# Patient Record
Sex: Female | Born: 1985
Health system: Southern US, Community
[De-identification: ages and names within clinical notes are randomized; demographics above are authoritative.]

## PROBLEM LIST (undated history)

## (undated) DIAGNOSIS — Z789 Other specified health status: Secondary | ICD-10-CM

## (undated) HISTORY — DX: Other specified health status: Z78.9

---
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
## 2008-10-13 ENCOUNTER — Ambulatory Visit: Payer: Self-pay | Admitting: Family Medicine

## 2008-10-28 ENCOUNTER — Ambulatory Visit: Payer: Self-pay | Admitting: Gynecology

## 2008-11-16 ENCOUNTER — Ambulatory Visit: Payer: Self-pay | Admitting: Gynecology

## 2009-02-27 ENCOUNTER — Emergency Department: Payer: Self-pay | Admitting: Emergency Medicine

## 2010-04-07 ENCOUNTER — Ambulatory Visit: Payer: Self-pay | Admitting: Family Medicine

## 2010-09-06 ENCOUNTER — Observation Stay: Payer: Self-pay | Admitting: Obstetrics and Gynecology

## 2010-09-07 ENCOUNTER — Ambulatory Visit: Payer: Self-pay

## 2010-09-08 ENCOUNTER — Observation Stay: Payer: Self-pay

## 2010-09-08 ENCOUNTER — Encounter: Payer: Self-pay | Admitting: Maternal and Fetal Medicine

## 2010-10-24 ENCOUNTER — Observation Stay: Payer: Self-pay | Admitting: Obstetrics & Gynecology

## 2010-12-25 ENCOUNTER — Observation Stay: Payer: Self-pay

## 2010-12-26 ENCOUNTER — Inpatient Hospital Stay: Payer: Self-pay | Admitting: Obstetrics & Gynecology

## 2010-12-28 DIAGNOSIS — O3432 Maternal care for cervical incompetence, second trimester: Secondary | ICD-10-CM

## 2014-05-08 ENCOUNTER — Emergency Department: Payer: Self-pay | Admitting: Emergency Medicine

## 2014-05-08 LAB — COMPREHENSIVE METABOLIC PANEL
ALT: 150 U/L — AB (ref 12–78)
ANION GAP: 5 — AB (ref 7–16)
Albumin: 4.2 g/dL (ref 3.4–5.0)
Alkaline Phosphatase: 110 U/L
BILIRUBIN TOTAL: 0.5 mg/dL (ref 0.2–1.0)
BUN: 12 mg/dL (ref 7–18)
CALCIUM: 9 mg/dL (ref 8.5–10.1)
CHLORIDE: 103 mmol/L (ref 98–107)
CO2: 28 mmol/L (ref 21–32)
Creatinine: 0.87 mg/dL (ref 0.60–1.30)
EGFR (African American): >60
EGFR (Non-African Amer.): >60
Glucose: 122 mg/dL — ABNORMAL HIGH (ref 65–99)
Osmolality: 273 (ref 275–301)
Potassium: 3.5 mmol/L (ref 3.5–5.1)
SGOT(AST): 89 U/L — ABNORMAL HIGH (ref 15–37)
Sodium: 136 mmol/L (ref 136–145)
TOTAL PROTEIN: 8.5 g/dL — AB (ref 6.4–8.2)

## 2014-05-08 LAB — URINALYSIS, COMPLETE
Bilirubin,UR: NEGATIVE
GLUCOSE, UR: NEGATIVE mg/dL (ref 0–75)
Ketone: NEGATIVE
NITRITE: NEGATIVE
PH: 5 (ref 4.5–8.0)
PROTEIN: NEGATIVE
RBC,UR: 7 /HPF (ref 0–5)
Specific Gravity: 1.03 (ref 1.003–1.030)
WBC UR: 13 /HPF (ref 0–5)

## 2014-05-08 LAB — CBC
HCT: 41.4 % (ref 35.0–47.0)
HGB: 13.9 g/dL (ref 12.0–16.0)
MCH: 29.9 pg (ref 26.0–34.0)
MCHC: 33.5 g/dL (ref 32.0–36.0)
MCV: 89 fL (ref 80–100)
Platelet: 243 10*3/uL (ref 150–440)
RBC: 4.64 10*6/uL (ref 3.80–5.20)
RDW: 12.8 % (ref 11.5–14.5)
WBC: 9.6 10*3/uL (ref 3.6–11.0)

## 2014-05-09 LAB — PREGNANCY, URINE: Pregnancy Test, Urine: NEGATIVE m[IU]/mL

## 2015-10-05 IMAGING — CT CT HEAD WITHOUT CONTRAST
1 series · 16 of 30 positions shown, 20 images · non-contrast
Comparison: None.

CLINICAL DATA: Syncope.  Headache.

EXAM:
CT HEAD WITHOUT CONTRAST
TECHNIQUE: Contiguous axial images were obtained from the base of the skull
through the vertex without intravenous contrast.

[Series 2: head wo · axial · 0.38mm/px · z∈[-95,+31]mm · 16 of 32 slices shown, 20 images]
[im 2/32  brain]
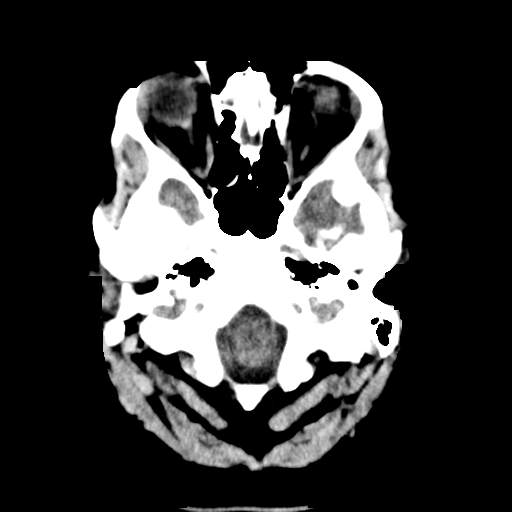
[im 2/32  bone]
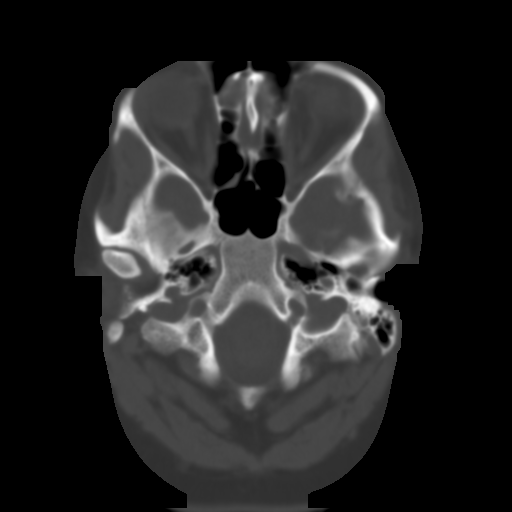
[im 4/32  brain]
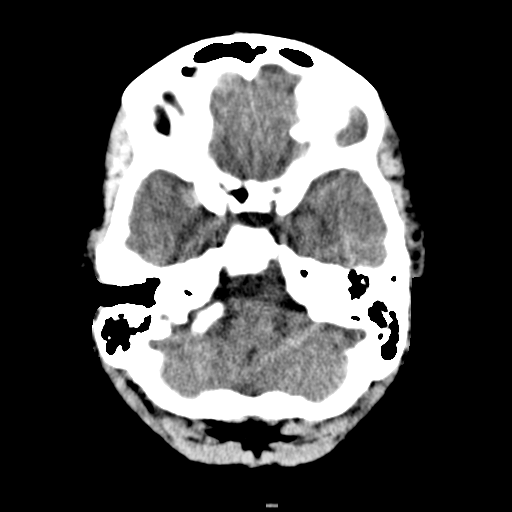
[im 6/32  brain]
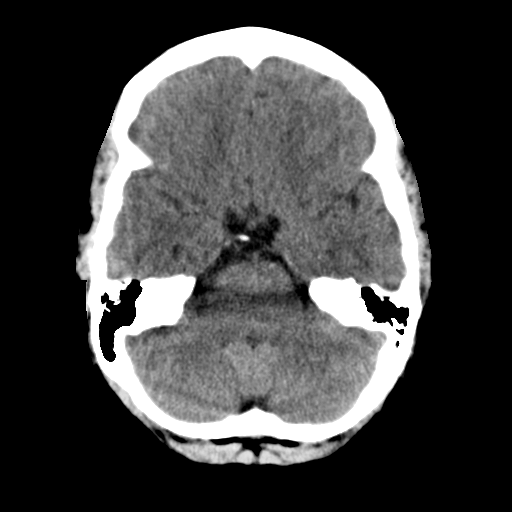
[im 8/32  brain]
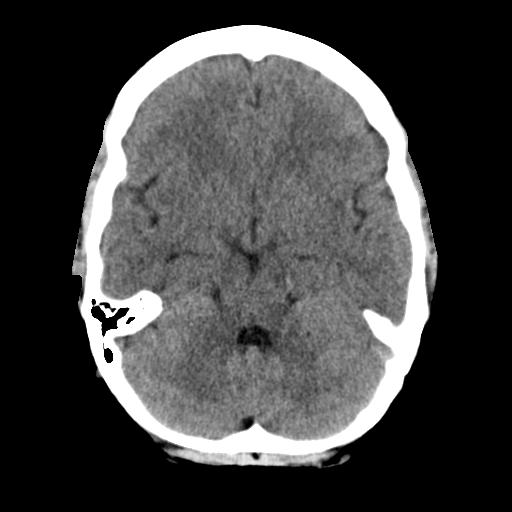
[im 9/32  brain]
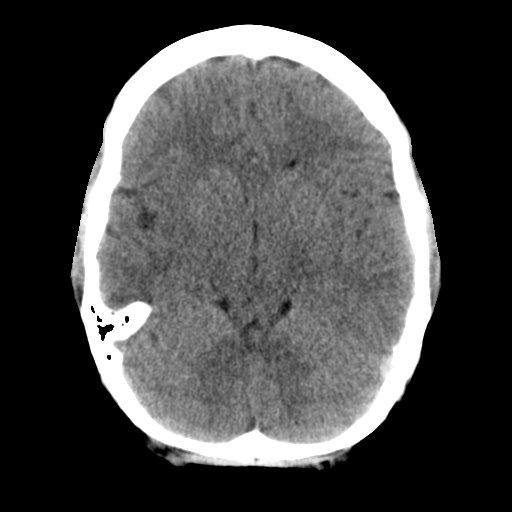
[im 9/32  bone]
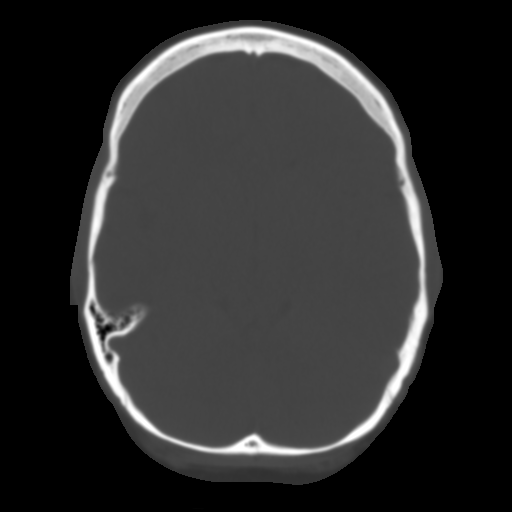
[im 11/32  brain]
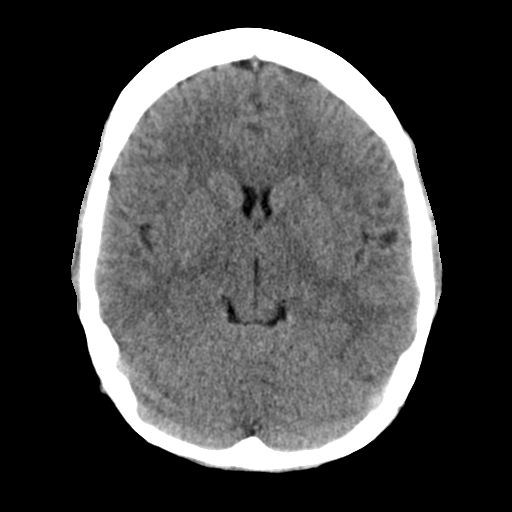
[im 13/32  brain]
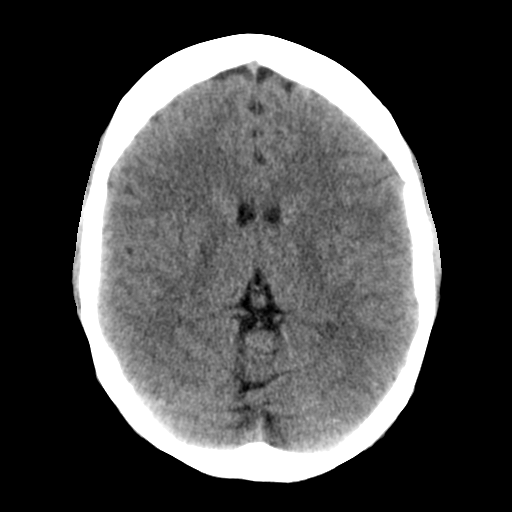
[im 15/32  brain]
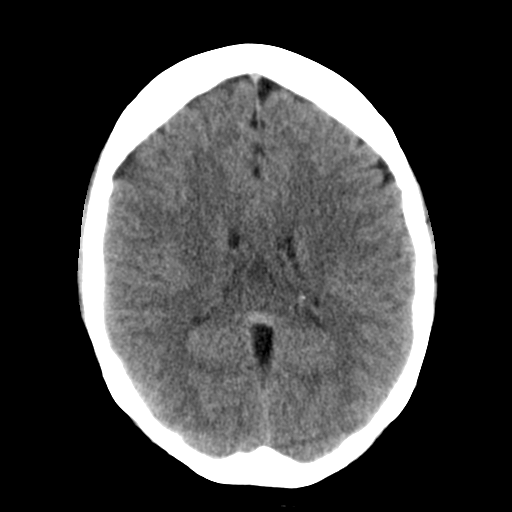
[im 17/32  brain]
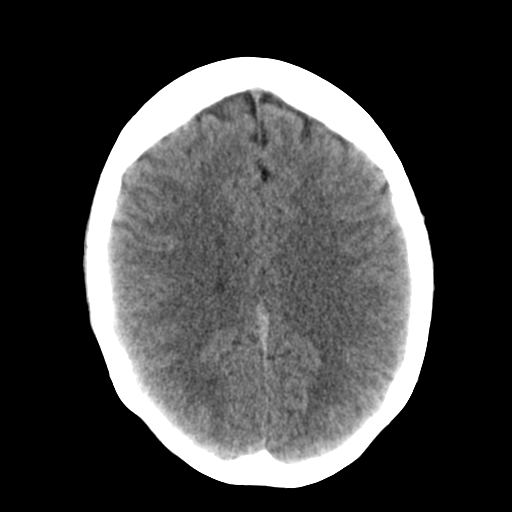
[im 17/32  bone]
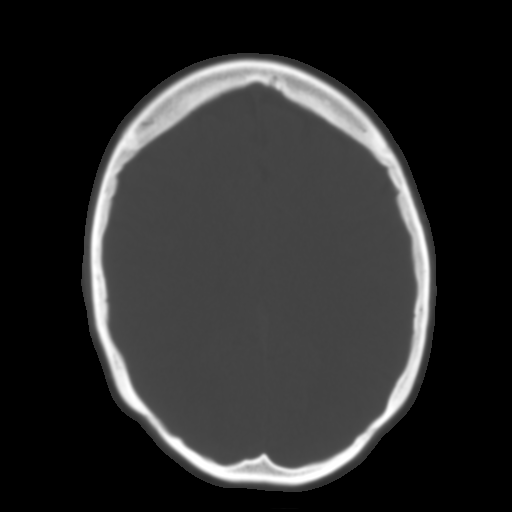
[im 19/32  brain]
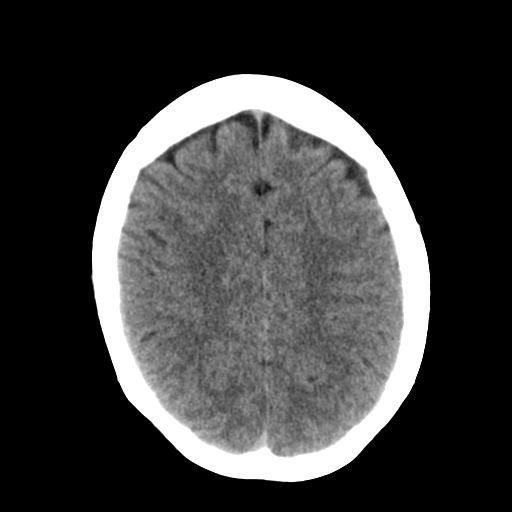
[im 21/32  brain]
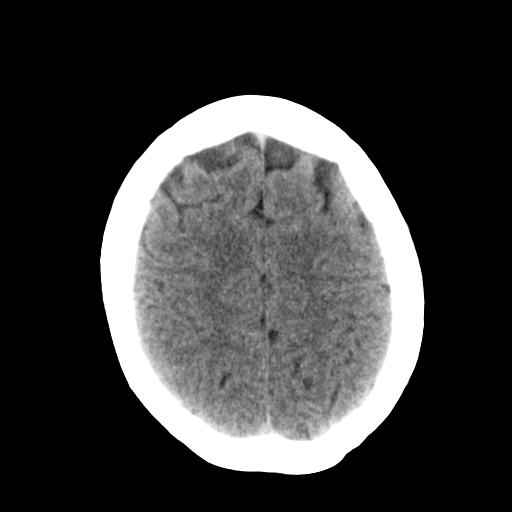
[im 23/32  brain]
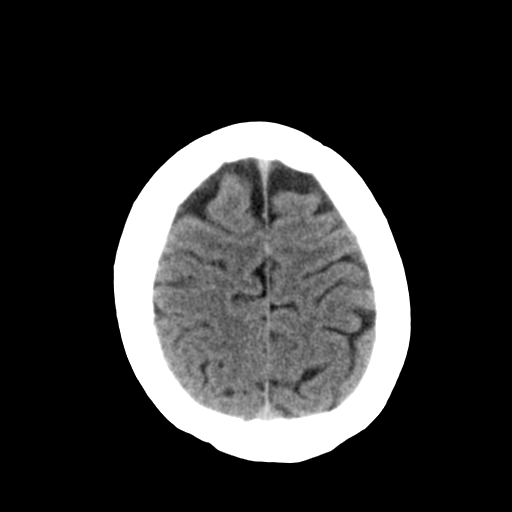
[im 24/32  brain]
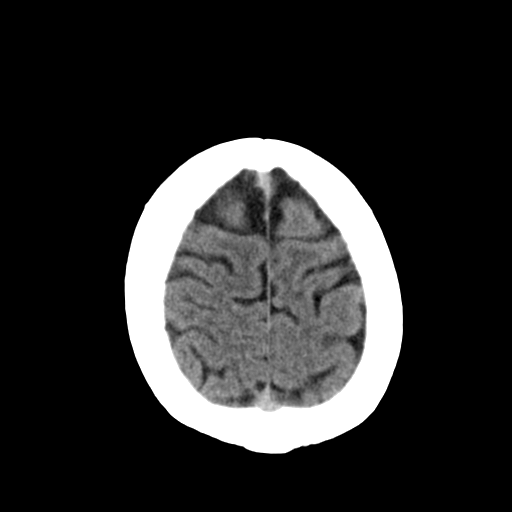
[im 24/32  bone]
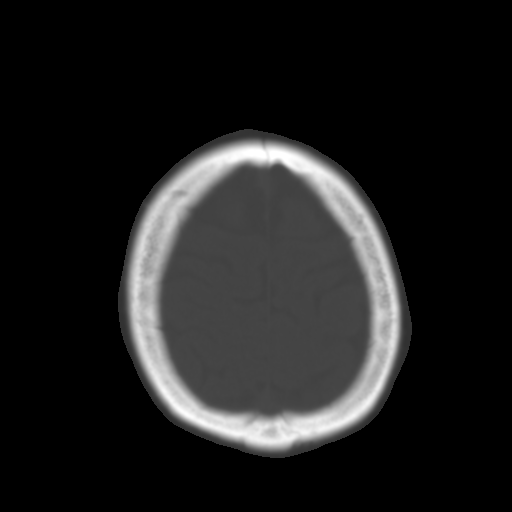
[im 26/32  brain]
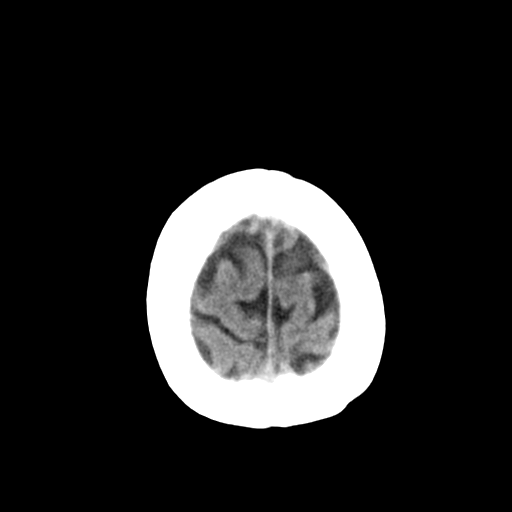
[im 28/32  brain]
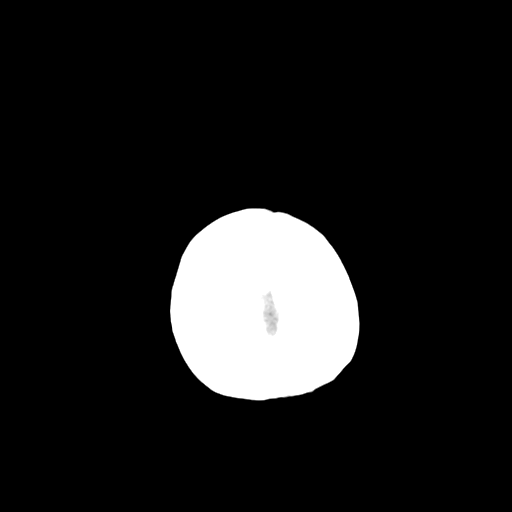
[im 30/32  brain]
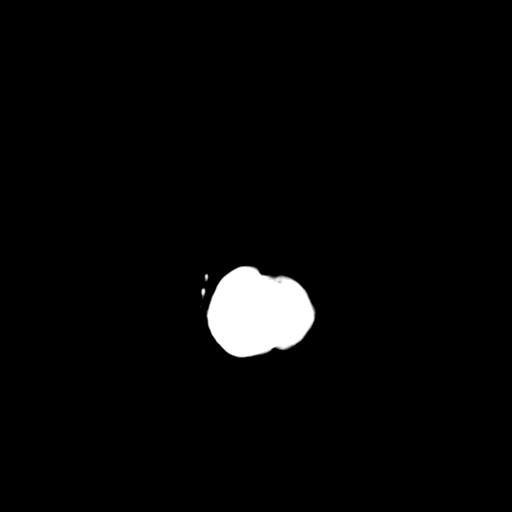

[16 of 30 positions shown; findings below may reference images not displayed]

FINDINGS: There is no evidence of acute infarction, mass lesion, or intra- or
extra-axial hemorrhage on CT.

The posterior fossa, including the cerebellum, brainstem and fourth
ventricle, is within normal limits. The third and lateral
ventricles, and basal ganglia are unremarkable in appearance. The
cerebral hemispheres are symmetric in appearance, with normal
gray-white differentiation. No mass effect or midline shift is seen.

There is no evidence of fracture; visualized osseous structures are
unremarkable in appearance. The visualized portions of the orbits
are within normal limits. The paranasal sinuses and mastoid air
cells are well-aerated. No significant soft tissue abnormalities are
seen.
IMPRESSION: Unremarkable noncontrast CT of the head.

## 2018-09-20 HISTORY — PX: CHOLECYSTECTOMY: SHX55

## 2020-11-30 ENCOUNTER — Ambulatory Visit: Admit: 2020-11-30 | Discharge status: Absent without leave | Payer: Self-pay

## 2023-02-21 ENCOUNTER — Ambulatory Visit (LOCAL_COMMUNITY_HEALTH_CENTER): Payer: Medicaid Other

## 2023-02-21 VITALS — BP 143/88 | Wt 166.5 lb

## 2023-02-21 DIAGNOSIS — Z3202 Encounter for pregnancy test, result negative: Secondary | ICD-10-CM

## 2023-02-21 LAB — PREGNANCY, URINE: Preg Test, Ur: NEGATIVE

## 2023-02-21 NOTE — Progress Notes (Signed)
UPT negative.  Pt states LMP 01/19/23 but has irregular menses.  Says she has done 3 home preg tests and faintly positive.  Wanting to be pregnant.  If no menses, pt will repeat preg test. See flowsheet.  Negative pregnancy packet provided.  Bp 143/88 and rechecked 171/84.  Denies h/a or vision problem but c/o some dizziness.  Pt advised to monitor Bp and f/u with PCP as needed.   Tonny Branch, RN   .

## 2024-09-23 ENCOUNTER — Ambulatory Visit (LOCAL_COMMUNITY_HEALTH_CENTER)

## 2024-09-23 VITALS — BP 139/83 | Ht 59.0 in | Wt 156.5 lb

## 2024-09-23 DIAGNOSIS — Z3201 Encounter for pregnancy test, result positive: Secondary | ICD-10-CM

## 2024-09-23 DIAGNOSIS — Z309 Encounter for contraceptive management, unspecified: Secondary | ICD-10-CM | POA: Diagnosis not present

## 2024-09-23 LAB — PREGNANCY, URINE: Preg Test, Ur: POSITIVE — AB

## 2024-09-23 MED ORDER — PRENATAL 27-0.8 MG PO TABS: 1.0000 | ORAL_TABLET | Freq: Every day | ORAL | Status: AC

## 2024-09-23 NOTE — Progress Notes (Signed)
 UPT positive. Unsure where she Plans prenatal care. Local prenatal provider list given and encouraged to establish care.   Positive preg packet given and reviewed.   The patient was dispensed prenatal vitamins #100 today per [facility] Dr JAYSON Helling. I provided counseling today regarding the medication. We discussed the medication, the side effects and when to call clinic. Patient given the opportunity to ask questions. Questions answered.    Sent to clerk for presumptive elig/medicaid/preg women. Kristen Nwosu, RN

## 2024-10-10 ENCOUNTER — Telehealth: Payer: Self-pay

## 2024-10-10 DIAGNOSIS — Z3480 Encounter for supervision of other normal pregnancy, unspecified trimester: Secondary | ICD-10-CM | POA: Insufficient documentation

## 2024-10-10 DIAGNOSIS — O3680X0 Pregnancy with inconclusive fetal viability, not applicable or unspecified: Secondary | ICD-10-CM

## 2024-10-10 DIAGNOSIS — Z3689 Encounter for other specified antenatal screening: Secondary | ICD-10-CM

## 2024-10-10 NOTE — Progress Notes (Signed)
 New OB Intake  I connected with  Kristen Coleman on 10/10/24 at  8:15 AM EST by MyChart Video Visit and verified that I am speaking with the correct person using two identifiers. Nurse is located at [facility] and pt is located at work.  I discussed the limitations, risks, security and privacy concerns of performing an evaluation and management service by telephone and the availability of in person appointments. I also discussed with the patient that there may be a patient responsible charge related to this service. The patient expressed understanding and agreed to proceed.  I explained I am completing New OB Intake today. We discussed her EDD of 05/27/25 that is based on LMP of 08/20/24. Pt is G3/P2002. I reviewed her allergies, medications, Medical/Surgical/OB history, and appropriate screenings. There are cats in the home: no. Based on history, this is a/an pregnancy uncomplicated . Her obstetrical history is significant for advanced maternal age, obesity, and CPD with ECD for G1 and RCD for G2. Patient would like to discuss VBAC.  Patient Active Problem List   Diagnosis Date Noted   Supervision of other normal pregnancy, antepartum 10/10/2024    Concerns addressed today: None  Delivery Plans:  Plans to deliver at [facility].  Anatomy US  Explained first scheduled US  will be 10/31/24. Anatomy US  will be scheduled around [redacted] weeks gestational age.  Labs Discussed genetic screening with patient. Patient desires genetic testing to be drawn at new OB visit. Discussed possible labs to be drawn at new OB appointment.  COVID Vaccine Patient has had COVID vaccine.   Social Determinants of Health Food Insecurity: expresses food insecurity. Information given on local food banks. WIC Referral: Patient is interested in referral to [facility].  Transportation: Patient denies transportation needs. Childcare: Discussed no children allowed at ultrasound appointments.   First visit  review I reviewed new OB appt with pt. I explained she will have blood work and pap smear/pelvic exam if indicated. Explained pt will be seen by Harlene Cisco, CNM at first visit; encounter routed to appropriate provider.   [place], LPN [id number]  9:02 AM

## 2024-10-10 NOTE — Patient Instructions (Signed)
 First Trimester of Pregnancy  The first trimester of pregnancy starts on the first day of your last monthly period until the end of week 13. This is months 1 through 3 of pregnancy. A week after a sperm fertilizes an egg, the egg will implant into the wall of the uterus and begin to develop into a baby. Body changes during your first trimester Your body goes through many changes during pregnancy. The changes usually return to normal after your baby is born. Physical changes Your breasts may grow larger and may hurt. The area around your nipples may get darker. Your periods will stop. Your hair and nails may grow faster. You may pee more often. Health changes You may tire easily. Your gums may bleed and may be sensitive when you brush and floss. You may not feel hungry. You may have heartburn. You may throw up or feel like you may throw up. You may want to eat some foods, but not others. You may have headaches. You may have trouble pooping (constipation). Other changes Your emotions may change from day to day. You may have more dreams. Follow these instructions at home: Medicines Talk to your health care provider if you're taking medicines. Ask if the medicines are safe to take during pregnancy. Your provider may change the medicines that you take. Do not take any medicines unless told to by your provider. Take a prenatal vitamin that has at least 600 micrograms (mcg) of folic acid. Do not use herbal medicines, illegal substances, or medicines that are not approved by your provider. Eating and drinking While you're pregnant your body needs extra food for your growing baby. Talk with your provider about what to eat while pregnant. Activity Most women are able to exercise during pregnancy. Exercises may need to change as your pregnancy goes on. Talk to your provider about your activities and exercise routines. Relieving pain and discomfort Wear a good, supportive bra if your breasts  hurt. Rest with your legs raised if you have leg cramps or low back pain. Safety Wear your seatbelt at all times when you're in a car. Talk to your provider if someone hits you, hurts you, or yells at you. Talk with your provider if you're feeling sad or have thoughts of hurting yourself. Lifestyle Certain things can be harmful while you're pregnant. Follow these rules: Do not use hot tubs, steam rooms, or saunas. Do not douche. Do not use tampons or scented pads. Do not drink alcohol,smoke, vape, or use products with nicotine or tobacco in them. If you need help quitting, talk with your provider. Avoid cat litter boxes and soil used by cats. These things carry germs that can cause harm to your pregnancy and your baby. General instructions Keep all follow-up visits. It helps you and your unborn baby stay as healthy as possible. Write down your questions. Take them to your visits. Your provider will: Talk with you about your overall health. Give you advice or refer you to specialists who can help with different needs, including: Prenatal education classes. Mental health and counseling. Foods and healthy eating. Ask for help if you need help with food. Call your dentist and ask to be seen. Brush your teeth with a soft toothbrush. Floss gently. Where to find more information American Pregnancy Association: americanpregnancy.org [employer]: acog.org Office on [employer] Health: [place] Contact a health care provider if: You feel dizzy, faint, or have a fever. You vomit or have watery poop (diarrhea) for 2  days or more. You have abnormal discharge or bleeding from your vagina. You have pain when you pee or your pee smells bad. You have cramps, pain, or pressure in your belly area. Get help right away if: You have trouble breathing or chest pain. You have any kind of injury, such as from a fall or a car crash. These symptoms may be an  emergency. Get help right away. Call 911. Do not wait to see if the symptoms will go away. Do not drive yourself to the hospital. This information is not intended to replace advice given to you by your health care provider. Make sure you discuss any questions you have with your health care provider. Document Revised: 08/09/2023 Document Reviewed: 03/09/2023 Elsevier Patient Education  2024 [employer] Severe Morning Sickness: What to Know Severe morning sickness is serious and can happen during pregnancy. You may hear it called hyperemesis gravidarum or HG. In severe morning sickness, you may vomit all day or for many days. You may also feel like vomiting for many days. Severe morning sickness can keep you from eating and drinking enough. This may make you lose too much fluid in your body, or become dehydrated. It can also lead to poor nutrition and weight loss. Severe morning sickness usually happens within the first 20 weeks of pregnancy. After that, it may go away for the rest of your pregnancy, but sometimes it doesn't. What are the causes? The cause of severe morning sickness is not known. It may be linked to: Changes in chemicals called hormones. Changes in the digestive system, such as the esophagus, the stomach, and the bowels. Conditions that are passed in families. What are the signs or symptoms? Very bad vomiting or feeling like you may vomit. These do not get better or go away. Loss of body fluids. No desire for food. Weight loss. How is this diagnosed? Severe morning sickness may be diagnosed based on your medical history, your symptoms, and an exam. Tests that may be done include: Blood tests. Urine tests. Checking blood pressure. How is this treated? You may be told to: Follow an eating plan to lessen vomiting. Eat or drink foods or fluids that have ginger, ginger ale, or ginger tea in them. Use acupressure bracelets or hypnosis. Take medicines. An eating plan and  medicines may be used together. If medicines don't help, you may need to get fluids through an IV. This is a small catheter that's put into a vein in your hand or arm. Follow these instructions at home: To help ease your symptoms, listen to your body. Everyone is different. Find what works best for you. Here are some things you can try to help relieve your symptoms: Meals and snacks  Eat 5-6 small meals or snacks instead of 3 large meals a day. Eat slowly. Before getting out of bed, eat a few crackers. Eat a snack with protein before bed. This could be cheese and crackers, or a peanut butter sandwich made with 1 slice of whole-wheat bread and 1 tsp (5 g) of peanut butter. Try to eat starchy foods as these are usually tolerated well. Examples include toast, bread, pasta, rice, and pretzels. Eat at least one serving of protein with your meals and snacks. Proteins include lean meats, poultry, seafood, beans, nuts, nut butters, eggs, cheese, and yogurt. Fluids It's important to have enough fluid in your body. Try to: Drink small amounts of fluids often. Drink fluids 30 minutes before or after a meal. This will help  lessen the feeling of a full stomach. Drink 100% fruit juice or an electrolyte drink. An electrolyte drink contains sodium, potassium, and chloride. Drink fluids that are cold, clear, and carbonated or sour. These include lemonade, ginger ale, lemon-lime soda, ice water, and sparkling water. Add  tsp (0.44 g) ground ginger to hot tea, or drink ginger tea. Other tips that can help Try to avoid: Eating foods that trigger your symptoms. These may include spicy foods, coffee, high-fat foods, and acidic foods. Drinking more than 1 cup of fluid at a time. Skipping meals. The feeling that you may vomit can be worse when your stomach is empty. But if you cannot tolerate food, don't force it. Try sucking on ice chips or other frozen liquids and make up for missed meals later. Do not lie down  for at least 2 hours after eating. Stay away from things that may make you vomit, such as: Food smells. Smoke. Strong smells. Loud noises. Warm, humid areas. Fast movements. General instructions Take your medicines only as told. Continue to take your prenatal vitamins as told. If you're having trouble taking them, talk with your provider about other choices. Talk with your provider about taking vitamin B6. Brush your teeth or use a mouth rinse after meals. Keep all follow-up visits. Your provider will check on your health and symptoms, as well as your baby's health. Contact a health care provider if: You have pain in your belly. You have a very bad headache. You can't see properly. You feel weak or dizzy. You can't eat or drink without throwing up, especially if this goes on for a full day. You throw up or feel like you may throw up, and the symptoms do not go away. You're losing weight. Get help right away if: There's blood in your vomit. You are very weak or faint. You have a fever and your symptoms suddenly get worse. You feel like your heart is racing or skipping a beat (palpitations). These symptoms may be an emergency. Get help right away. If you can't reach your provider, go to an urgent care or emergency room, or call 911. Do not wait to see if the symptoms will go away. Do not drive yourself to the hospital. This information is not intended to replace advice given to you by your health care provider. Make sure you discuss any questions you have with your health care provider. Document Revised: 05/02/2023 Document Reviewed: 05/02/2023 Elsevier Patient Education  2024 Else[employer] Inc. Commonly Asked Questions During Pregnancy  Cats: A parasite can be excreted in cat feces.  To avoid exposure you need to have another person empty the little box.  If you must empty the litter box you will need to wear gloves.  Wash your hands after handling your cat.  This parasite can also be  found in raw or undercooked meat so this should also be avoided.  Colds, Sore Throats, Flu: Please check your medication sheet to see what you can take for symptoms.  If your symptoms are unrelieved by these medications please call the office.  Dental Work: Most any dental work agricultural consultant recommends is permitted.  X-rays should only be taken during the first trimester if absolutely necessary.  Your abdomen should be shielded with a lead apron during all x-rays.  Please notify your provider prior to receiving any x-rays.  Novocaine is fine; gas is not recommended.  If your dentist requires a note from us  prior to dental work please call the office and  we will provide one for you.  Exercise: Exercise is an important part of staying healthy during your pregnancy.  You may continue most exercises you were accustomed to prior to pregnancy.  Later in your pregnancy you will most likely notice you have difficulty with activities requiring balance like riding a bicycle.  It is important that you listen to your body and avoid activities that put you at a higher risk of falling.  Adequate rest and staying well hydrated are a must!  If you have questions about the safety of specific activities ask your provider.    Exposure to Children with illness: Try to avoid obvious exposure; report any symptoms to us  when noted,  If you have chicken pos, red measles or mumps, you should be immune to these diseases.   Please do not take any vaccines while pregnant unless you have checked with your OB provider.  Fetal Movement: After 28 weeks we recommend you do kick counts twice daily.  Lie or sit down in a calm quiet environment and count your baby movements kicks.  You should feel your baby at least 10 times per hour.  If you have not felt 10 kicks within the first hour get up, walk around and have something sweet to eat or drink then repeat for an additional hour.  If count remains less than 10 per hour notify your  provider.  Fumigating: Follow your pest control agent's advice as to how long to stay out of your home.  Ventilate the area well before re-entering.  Hemorrhoids:   Most over-the-counter preparations can be used during pregnancy.  Check your medication to see what is safe to use.  It is important to use a stool softener or fiber in your diet and to drink lots of liquids.  If hemorrhoids seem to be getting worse please call the office.   Hot Tubs:  Hot tubs Jacuzzis and saunas are not recommended while pregnant.  These increase your internal body temperature and should be avoided.  Intercourse:  Sexual intercourse is safe during pregnancy as long as you are comfortable, unless otherwise advised by your provider.  Spotting may occur after intercourse; report any bright red bleeding that is heavier than spotting.  Labor:  If you know that you are in labor, please go to the hospital.  If you are unsure, please call the office and let us  help you decide what to do.  Lifting, straining, etc:  If your job requires heavy lifting or straining please check with your provider for any limitations.  Generally, you should not lift items heavier than that you can lift simply with your hands and arms (no back muscles)  Painting:  Paint fumes do not harm your pregnancy, but may make you ill and should be avoided if possible.  Latex or water based paints have less odor than oils.  Use adequate ventilation while painting.  Permanents & Hair Color:  Chemicals in hair dyes are not recommended as they cause increase hair dryness which can increase hair loss during pregnancy.   Highlighting and permanents are allowed.  Dye may be absorbed differently and permanents may not hold as well during pregnancy.  Sunbathing:  Use a sunscreen, as skin burns easily during pregnancy.  Drink plenty of fluids; avoid over heating.  Tanning Beds:  Because their possible side effects are still unknown, tanning beds are not  recommended.  Ultrasound Scans:  Routine ultrasounds are performed at approximately 20 weeks.  You will be able  to see your baby's general anatomy an if you would like to know the gender this can usually be determined as well.  If it is questionable when you conceived you may also receive an ultrasound early in your pregnancy for dating purposes.  Otherwise ultrasound exams are not routinely performed unless there is a medical necessity.  Although you can request a scan we ask that you pay for it when conducted because insurance does not cover  patient request scans.  Work: If your pregnancy proceeds without complications you may work until your due date, unless your physician or employer advises otherwise.  Round Ligament Pain/Pelvic Discomfort:  Sharp, shooting pains not associated with bleeding are fairly common, usually occurring in the second trimester of pregnancy.  They tend to be worse when standing up or when you remain standing for long periods of time.  These are the result of pressure of certain pelvic ligaments called round ligaments.  Rest, Tylenol and heat seem to be the most effective relief.  As the womb and fetus grow, they rise out of the pelvis and the discomfort improves.  Please notify the office if your pain seems different than that described.  It may represent a more serious condition.   Common Medications Safe in Pregnancy  Acne:      Constipation:  Benzoyl Peroxide     Colace  Clindamycin      Dulcolax Suppository  Topica Erythromycin     Fibercon  Salicylic Acid      Metamucil         Miralax AVOID:        Senakot   Accutane    Cough:  Retin-A       Cough Drops  Tetracycline      Phenergan w/ Codeine if Rx  Minocycline      Robitussin (Plain & DM)  Antibiotics:     Crabs/Lice:  Ceclor       RID  Cephalosporins    AVOID:  E-Mycins      Kwell  Keflex  Macrobid/Macrodantin   Diarrhea:  Penicillin      Kao-Pectate  Zithromax      Imodium AD         PUSH  FLUIDS AVOID:       Cipro     Fever:  Tetracycline      Tylenol (Regular or Extra  Minocycline       Strength)  Levaquin      Extra Strength-Do not          Exceed 8 tabs/24 hrs Caffeine:        200mg /day (equiv. To 1 cup of coffee or  approx. 3 12 oz sodas)         Gas: Cold/Hayfever:       Gas-X  Benadryl      Mylicon  Claritin       Phazyme  **Claritin-D        Chlor-Trimeton    Headaches:  Dimetapp      ASA-Free Excedrin  Drixoral-Non-Drowsy     Cold Compress  Mucinex (Guaifenasin)     Tylenol (Regular or Extra  Sudafed/Sudafed-12 Hour     Strength)  **Sudafed PE Pseudoephedrine   Tylenol Cold & Sinus     Vicks Vapor Rub  Zyrtec  **AVOID if Problems With Blood Pressure         Heartburn: Avoid lying down for at least 1 hour after meals  Aciphex      Maalox  Rash:  Milk of Magnesia     Benadryl    Mylanta       1% Hydrocortisone Cream  Pepcid  Pepcid Complete   Sleep Aids:  Prevacid      Ambien   Prilosec       Benadryl  Rolaids       Chamomile Tea  Tums (Limit 4/day)     Unisom         Tylenol PM         Warm milk-add vanilla or  Hemorrhoids:       Sugar for taste  Anusol/Anusol H.C.  (RX: Analapram 2.5%)  Sugar Substitutes:  Hydrocortisone OTC     Ok in moderation  Preparation H      Tucks        Vaseline lotion applied to tissue with wiping    Herpes:     Throat:  Acyclovir      Oragel  Famvir  Valtrex     Vaccines:         Flu Shot Leg Cramps:       *Gardasil  Benadryl      Hepatitis A         Hepatitis B Nasal Spray:       Pneumovax  Saline Nasal Spray     Polio Booster         Tetanus Nausea:       Tuberculosis test or PPD  Vitamin B6 25 mg TID   AVOID:    Dramamine      *Gardasil  Emetrol       Live Poliovirus  Ginger Root 250 mg QID    MMR (measles, mumps &  High Complex Carbs @ Bedtime    rebella)  Sea Bands-Accupressure    Varicella (Chickenpox)  Unisom 1/2 tab TID     *No known complications           If received  before Pain:         Known pregnancy;   Darvocet       Resume series after  Lortab        Delivery  Percocet    Yeast:   Tramadol      Femstat  Tylenol 3      Gyne-lotrimin  Ultram       Monistat  Vicodin           MISC:         All Sunscreens           Hair Coloring/highlights          Insect Repellant's          (Including DEET)         Mystic Tans  Questions to Ask Your Health Care Provider During Pregnancy  During pregnancy, you'll go through many changes. These will affect your body as well as your feelings and emotions. And you'll likely have a lot of questions. Your health care team is a good source for reliable answers. Make an appointment with your team if you're planning to get pregnant or as soon as you know that you're pregnant. New questions will come up as your pregnancy continues. Write your questions down and take them with you to your prenatal visits. Questions to ask about pregnancy Prenatal visits and tests How often will I have my prenatal visits? Should I visit the dentist during pregnancy? What type of screening tests should I consider? What type of routine tests are suggested and when  are they done? What are the risks and benefits of these tests? When would you recommend an ultrasound, and what will it show? Is there a nurse line or office line I can call if I have questions? Caring for yourself during pregnancy How much weight should I gain? What kind of exercise should I get and how much? How much sleep should I get? What kinds of things can I do to help with stress and anxiety? Are there any travel restrictions? Is it OK to have sex? Eating and drinking during pregnancy What vitamins or supplements do you recommend? When should I start taking my prenatal vitamins? What's a healthy diet for me during pregnancy? What foods should I eat? What foods should I not eat? What if I'm on a special diet? Should I limit how much caffeine I  have? Vaccinations What shots should I get? When is the best time to get them? Are there shots I should not get? Medicine and substance use during pregnancy Are my current medicines OK to keep taking? Which medicines could hurt me or my baby? Which supplements or herbal medicines could hurt me or my baby? Why is it important not to smoke or drink alcohol? What about other drugs or substances? Where can I get help if I'm having a hard time quitting? Questions to ask about labor and birth Getting ready What are my birth options? Should I make a birth plan? Where can I have my baby? Is a birth center or home birth an option for me? What are the benefits of breastfeeding? When should I start preparing for breastfeeding? Classes Are there breastfeeding classes or support groups? Where can I find childbirth classes? Pain relief What is natural childbirth? What can I do to decrease pain during labor and birth? What are other methods to help with pain during labor and birth? Birth What is induced labor? What's an episiotomy, and when might I need one? How can I increase my chance for a vaginal birth? When would a C-section be advised? Can I have a vaginal birth if I had a C-section in the past? Other questions to ask How long will I need to stay in the hospital after giving birth? How soon can I get pregnant after giving birth? What are my birth control options? What are the signs of perinatal depression? What should I do if I have depression or anxiety that's getting worse? This information is not intended to replace advice given to you by your health care provider. Make sure you discuss any questions you have with your health care provider. Document Revised: 07/31/2023 Document Reviewed: 07/31/2023 Elsevier Patient Education  2024 [employer] Inc. Tests and Screening During Pregnancy Tests and screenings during pregnancy are an important part of your prenatal care. These tests help  your health care provider find any problems that might affect your pregnancy. Some tests need to be done for all pregnant people, and some are optional. Most of the tests and screenings do not pose any risks for you or your baby. You may need more testing if a test result shows there is a risk to your health or your baby's health. Tests and screenings done early in pregnancy Some tests and screenings you may have in early pregnancy are: Blood tests, such as: Complete blood count (CBC). Blood typing. Tests to check for diseases that can cause birth defects or can be passed to your baby, such as: German measles (rubella( and chicken pox. Hepatitis B and C. Human  Immunodeficiency Virus (HIV). Syphilis. Zika virus. Pee tests. Blood pressure. Testing for sexually transmitted infections (STIs), such as chlamydia or gonorrhea. Testing for tuberculosis. Ultrasound. Tests and screenings done later in pregnancy Some common tests you can expect to have later in pregnancy include: Rh antibody testing. Pee and blood tests. Glucose screening. This checks your blood sugar. It will show whether you are developing the type of diabetes that happens during pregnancy, called gestational diabetes. You may have this screening earlier if you have risk factors for diabetes. Ultrasound. This may be repeated at 16-20 weeks to check how your baby is growing. Screening for group B streptococcus (GBS). GBS is a type of bacteria that may live in your rectum or vagina. GBS can spread to your baby during birth. This test is done at 35-37 weeks of pregnancy. Non-stress test. This may be done more often if your pregnancy is high risk. Biophysical profile. This test includes ultrasound imaging and a non-stress test to check to see if your baby is healthy. This test may help decide when your baby should be born. Screening for birth defects Early in your pregnancy, tests can be done to find out if your baby is at risk for a  genetic disorder. This testing is optional. The type of testing recommended for you will depend on your family and medical history, your ethnicity, and your age. Testing may include: Screening tests such as ultrasound, blood tests, or a combination of both. Carrier screening. If genetic screening shows that your baby is at risk for a genetic defect, diagnostic testing may be recommended, such as: Amniocentesis. Chorionic villus sampling. Unlike other tests done during pregnancy, diagnostic testing does have some risk for your pregnancy. Talk to your provider about the risks and benefits of genetic testing. Questions to ask your health care provider What tests are recommended for me? When and how will these tests be done? When will I get the results of the tests? What do the results of these tests mean for me or my baby? Do you recommend any genetic screening tests? Which ones? Should I see a genetic counselor before having genetic screening? Where to find more information Go to [email or web address] Click on search. Type 'prenatal tests in the search box. Go to [email or web address] Click on search. Type 'prenatal tests in the search box. Go to [email or web address] Click on search. Type routine tests in the search box. This information is not intended to replace advice given to you by your health care provider. Make sure you discuss any questions you have with your health care provider. Document Revised: 09/04/2023 Document Reviewed: 09/04/2023 Elsevier Patient Education  2025 [employer]. How a Baby Grows During Pregnancy Pregnancy starts when a fertilized egg attaches to the lining of the uterus and begins to grow. It is a time of many changes in the mother's body. The changes happen: To support your pregnancy. To help the baby grow. To prepare for the birth of your baby. How long does a pregnancy last? A pregnancy usually lasts 280 days, or about 40 weeks. Pregnancy is divided into three  periods of growth, also called trimesters: First trimester: weeks 0-13. Second trimester: weeks 14-27. Third trimester: weeks 28-40. The end of the 40 weeks of pregnancy is your likely date of delivery, or due date. However, most babies are not born on their due date. How does my baby develop month by month?  First and second month The brain, spinal cord, and heart begin to develop. By 6 weeks, the  heart begins to beat. The face, arms, and legs begin to form. Then the hands and feet begin to develop. All major organs begin to develop by the end of the second month. Third month All of the internal organs are forming. Bones and muscles are beginning to grow. The fetus is making movements similar to breathing. Fingernails and toenails are forming. Fourth month The skin is thin and transparent. The neck, outer ear, eyelids, and fingernails are formed. The external sex organs are formed. The fetus can hear, swallow, and move easily. The kidneys begin to make pee (urine). Fifth month The fetus moves around more and can be felt for the first time (quickening). The face, nose, and lips can be seen easily on ultrasound. The baby is covered with soft hair called lanugo. The organs in the digestive system work. Sixth month The lungs continue to grow and mature. The eyes open. The brain continues to develop. The fetus may begin to suck a finger. Skin ridges are formed that will be fingerprints and toe prints. Hair grows thicker and eyebrows can be seen. Seventh month Lungs are fully developed but not yet ready for birth. Eyes are developed enough to sense changes in light. The fetus responds to sound. The fetus kicks and stretches. Hands can make a grasping motion. Vernix, a waxy coating, is starting to develop to protect the skin. Eighth month Most organs and body systems are fully developed and working. Bones harden, and taste buds develop. The fetus may hiccup. The brain is still  developing. The skull remains soft. By week 31, most development is complete and the fetus is gaining weight fast. By the end of week 32, the fetus weighs a little more than 4 pounds (1.8 kg). Ninth month until your due date The lungs are fully developed and ready for birth. Patterns of sleep develop. The fetus weighs around 6 pounds at the beginning of the ninth month and may weigh around 8 pounds by your due date. The fetus's head typically moves into a head-down position in the uterus. Closer to your due date, the fetus's head may drop lower in your hips. How do I know if my baby is developing well? Always talk with your health care provider about any concerns that you may have about your pregnancy and your baby. At prenatal visits, your provider will do tests to check on your health and keep track of your baby's growth. These include: Fundal height and position. To do this, your provider will: Measure your growing belly from your pubic bone to the top of the uterus using a tape measure. Feel your belly to determine your baby's position. Heartbeat. An ultrasound in the first trimester can confirm pregnancy and show a heartbeat, depending on how far along you are. Your provider will check your baby's heart rate at prenatal visits. You may also have a second trimester ultrasound to check your baby's development. Follow these instructions at home: Take your medicines as told. Take prenatal vitamins as told by your provider. These include vitamins such as folic acid, iron, calcium, and vitamin D. They are important for healthy development of your baby. Keep all follow-up visits. These include prenatal care and screening tests to check your health and your baby's health. This information is not intended to replace advice given to you by your health care provider. Make sure you discuss any questions you have with your health care provider. Document Revised: 04/09/2023 Document Reviewed:  04/09/2023 Elsevier Patient Education  2024 Elsevier  Inc.

## 2024-10-31 ENCOUNTER — Other Ambulatory Visit: Payer: Self-pay

## 2024-11-07 ENCOUNTER — Other Ambulatory Visit: Payer: Self-pay | Admitting: Certified Nurse Midwife

## 2024-11-07 ENCOUNTER — Ambulatory Visit
Admission: RE | Admit: 2024-11-07 | Discharge: 2024-11-07 | Disposition: A | Discharge status: Home / Self Care | Payer: Self-pay | Source: Ambulatory Visit | Attending: Certified Nurse Midwife | Admitting: Certified Nurse Midwife

## 2024-11-07 DIAGNOSIS — Z3480 Encounter for supervision of other normal pregnancy, unspecified trimester: Secondary | ICD-10-CM

## 2024-11-07 DIAGNOSIS — O3680X0 Pregnancy with inconclusive fetal viability, not applicable or unspecified: Secondary | ICD-10-CM | POA: Insufficient documentation
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
## 2024-11-14 ENCOUNTER — Encounter: Payer: Self-pay | Admitting: Certified Nurse Midwife

## 2024-11-14 DIAGNOSIS — Z1151 Encounter for screening for human papillomavirus (HPV): Secondary | ICD-10-CM

## 2024-11-14 DIAGNOSIS — Z113 Encounter for screening for infections with a predominantly sexual mode of transmission: Secondary | ICD-10-CM

## 2024-11-14 DIAGNOSIS — Z369 Encounter for antenatal screening, unspecified: Secondary | ICD-10-CM

## 2024-11-14 DIAGNOSIS — Z114 Encounter for screening for human immunodeficiency virus [HIV]: Secondary | ICD-10-CM

## 2024-11-14 DIAGNOSIS — Z0184 Encounter for antibody response examination: Secondary | ICD-10-CM

## 2024-11-14 DIAGNOSIS — Z124 Encounter for screening for malignant neoplasm of cervix: Secondary | ICD-10-CM

## 2024-11-14 DIAGNOSIS — Z98891 History of uterine scar from previous surgery: Secondary | ICD-10-CM

## 2024-11-14 DIAGNOSIS — Z3480 Encounter for supervision of other normal pregnancy, unspecified trimester: Secondary | ICD-10-CM
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
## 2024-11-24 ENCOUNTER — Other Ambulatory Visit (HOSPITAL_COMMUNITY)
Admission: RE | Admit: 2024-11-24 | Discharge: 2024-11-24 | Disposition: A | Discharge status: Home / Self Care | Source: Ambulatory Visit | Attending: Certified Nurse Midwife | Admitting: Certified Nurse Midwife

## 2024-11-24 ENCOUNTER — Encounter: Payer: Self-pay | Admitting: Certified Nurse Midwife

## 2024-11-24 ENCOUNTER — Ambulatory Visit (INDEPENDENT_AMBULATORY_CARE_PROVIDER_SITE_OTHER): Admitting: Certified Nurse Midwife

## 2024-11-24 VITALS — BP 133/87 | HR 83 | Wt 162.3 lb

## 2024-11-24 DIAGNOSIS — Z3481 Encounter for supervision of other normal pregnancy, first trimester: Secondary | ICD-10-CM

## 2024-11-24 DIAGNOSIS — Z113 Encounter for screening for infections with a predominantly sexual mode of transmission: Secondary | ICD-10-CM | POA: Diagnosis present

## 2024-11-24 DIAGNOSIS — Z114 Encounter for screening for human immunodeficiency virus [HIV]: Secondary | ICD-10-CM

## 2024-11-24 DIAGNOSIS — Z131 Encounter for screening for diabetes mellitus: Secondary | ICD-10-CM

## 2024-11-24 DIAGNOSIS — Z3482 Encounter for supervision of other normal pregnancy, second trimester: Secondary | ICD-10-CM

## 2024-11-24 DIAGNOSIS — Z98891 History of uterine scar from previous surgery: Secondary | ICD-10-CM | POA: Insufficient documentation

## 2024-11-24 DIAGNOSIS — Z1379 Encounter for other screening for genetic and chromosomal anomalies: Secondary | ICD-10-CM

## 2024-11-24 DIAGNOSIS — Z8759 Personal history of other complications of pregnancy, childbirth and the puerperium: Secondary | ICD-10-CM | POA: Insufficient documentation

## 2024-11-24 DIAGNOSIS — Z3A13 13 weeks gestation of pregnancy: Secondary | ICD-10-CM | POA: Diagnosis not present

## 2024-11-24 DIAGNOSIS — Z0283 Encounter for blood-alcohol and blood-drug test: Secondary | ICD-10-CM

## 2024-11-24 MED ORDER — ASPIRIN 81 MG PO TBEC: 81.0000 mg | DELAYED_RELEASE_TABLET | Freq: Every day | ORAL | 2 refills | Status: AC

## 2024-11-24 NOTE — Patient Instructions (Signed)
 Morning Sickness  Morning sickness is when you throw up or feel like you may throw up during pregnancy. This condition often occurs in the morning, but it can also occur at any time of day. Morning sickness is most common during the first three months of pregnancy, but it can go on throughout the pregnancy. Morning sickness is usually harmless. But if you throw up all the time, you should see your health care provider. You may also hear this condition called nausea and vomiting of pregnancy. What are the causes? The cause of morning sickness is not known. It may be linked to changes in hormones during pregnancy. What increases the risk? You're more likely to have morning sickness if: You had morning sickness in another pregnancy. You're pregnant with more than one baby, such as twins. You had morning sickness in other pregnancies. You have had motion sickness before you were pregnant. You have had bad headaches or migraines before you were pregnant. What are the signs or symptoms? Symptoms of morning sickness include: Feeling like you may throw up. Throwing up. How is this diagnosed? Morning sickness is diagnosed based on your symptoms. How is this treated? Treatment is usually not needed for morning sickness. You may only need to change what you eat. In some cases, your provider may give you: Vitamin B6 supplements. Medicines to prevent throwing up. Ginger. Follow these instructions at home: Medicines Take your medicines only as told by your provider. Do not use any prescription, over-the-counter, or herbal medicines for morning sickness without first talking with your provider. Take prenatal vitamins. These can stop or lessen the symptoms of morning sickness. If you feel like you may throw up after taking prenatal vitamins, take them at night or with a snack. Eating and drinking     Eat dry toast or crackers before getting out of bed. Eat 5 or 6 small meals a day. Try ginger  ale made with real ginger, ginger tea, or ginger candies. Drink fluids throughout the day. Eat protein foods when you need a snack. Nuts, yogurt, and cheese are good choices. Eat dry and bland foods like rice or baked potatoes. Foods that are high in carbohydrates are often helpful. Have someone cook for you if the smell of food makes you want to throw up. Foods to avoid Greasy foods. Fatty foods. Spicy foods. General instructions Try to avoid smells that make you feel sick. Use an air purifier to keep the air in your house free of smells. Try using an acupressure wristband. This is a wristband that's used to treat motion sickness. Try acupuncture. In this treatment, a provider puts thin needles into certain areas of your body to make you feel better. Brush your teeth after throwing up or rinse with a mix of baking soda and water. The acid in throw-up can hurt your teeth. Contact a health care provider if: Your symptoms do not get better. You feel dizzy or light-headed. You're losing weight. Get help right away if: The feeling that you may throw up will not go away, or you can't stop throwing up. You faint. You have very bad pain in your belly. This information is not intended to replace advice given to you by your health care provider. Make sure you discuss any questions you have with your health care provider. Document Revised: 08/09/2023 Document Reviewed: 02/15/2023 Elsevier Patient Education  2024 [employer].  First Trimester of Pregnancy  The first trimester of pregnancy starts on the first day of your  last monthly period until the end of week 13. This is months 1 through 3 of pregnancy. A week after a sperm fertilizes an egg, the egg will implant into the wall of the uterus and begin to develop into a baby. Body changes during your first trimester Your body goes through many changes during pregnancy. The changes usually return to normal after your baby is born. Physical  changes Your breasts may grow larger and may hurt. The area around your nipples may get darker. Your periods will stop. Your hair and nails may grow faster. You may pee more often. Health changes You may tire easily. Your gums may bleed and may be sensitive when you brush and floss. You may not feel hungry. You may have heartburn. You may throw up or feel like you may throw up. You may want to eat some foods, but not others. You may have headaches. You may have trouble pooping (constipation). Other changes Your emotions may change from day to day. You may have more dreams. Follow these instructions at home: Medicines Talk to your health care provider if you're taking medicines. Ask if the medicines are safe to take during pregnancy. Your provider may change the medicines that you take. Do not take any medicines unless told to by your provider. Take a prenatal vitamin that has at least 600 micrograms (mcg) of folic acid. Do not use herbal medicines, illegal substances, or medicines that are not approved by your provider. Eating and drinking While you're pregnant your body needs extra food for your growing baby. Talk with your provider about what to eat while pregnant. Activity Most women are able to exercise during pregnancy. Exercises may need to change as your pregnancy goes on. Talk to your provider about your activities and exercise routines. Relieving pain and discomfort Wear a good, supportive bra if your breasts hurt. Rest with your legs raised if you have leg cramps or low back pain. Safety Wear your seatbelt at all times when you're in a car. Talk to your provider if someone hits you, hurts you, or yells at you. Talk with your provider if you're feeling sad or have thoughts of hurting yourself. Lifestyle Certain things can be harmful while you're pregnant. Follow these rules: Do not use hot tubs, steam rooms, or saunas. Do not douche. Do not use tampons or scented  pads. Do not drink alcohol,smoke, vape, or use products with nicotine or tobacco in them. If you need help quitting, talk with your provider. Avoid cat litter boxes and soil used by cats. These things carry germs that can cause harm to your pregnancy and your baby. General instructions Keep all follow-up visits. It helps you and your unborn baby stay as healthy as possible. Write down your questions. Take them to your visits. Your provider will: Talk with you about your overall health. Give you advice or refer you to specialists who can help with different needs, including: Prenatal education classes. Mental health and counseling. Foods and healthy eating. Ask for help if you need help with food. Call your dentist and ask to be seen. Brush your teeth with a soft toothbrush. Floss gently. Where to find more information American Pregnancy Association: americanpregnancy.org [employer]: acog.org Office on [employer] Health: [employer] Contact a health care provider if: You feel dizzy, faint, or have a fever. You vomit or have watery poop (diarrhea) for 2 days or more. You have abnormal discharge or bleeding from your vagina. You have pain when  you pee or your pee smells bad. You have cramps, pain, or pressure in your belly area. Get help right away if: You have trouble breathing or chest pain. You have any kind of injury, such as from a fall or a car crash. These symptoms may be an emergency. Get help right away. Call 911. Do not wait to see if the symptoms will go away. Do not drive yourself to the hospital. This information is not intended to replace advice given to you by your health care provider. Make sure you discuss any questions you have with your health care provider. Document Revised: 08/09/2023 Document Reviewed: 03/09/2023 Elsevier Patient Education  2024 Elsevier Inc.  Commonly Asked Questions During Pregnancy  Cats: A parasite  can be excreted in cat feces.  To avoid exposure you need to have another person empty the little box.  If you must empty the litter box you will need to wear gloves.  Wash your hands after handling your cat.  This parasite can also be found in raw or undercooked meat so this should also be avoided.  Colds, Sore Throats, Flu: Please check your medication sheet to see what you can take for symptoms.  If your symptoms are unrelieved by these medications please call the office.  Dental Work: Most any dental work Agricultural consultant recommends is permitted.  X-rays should only be taken during the first trimester if absolutely necessary.  Your abdomen should be shielded with a lead apron during all x-rays.  Please notify your provider prior to receiving any x-rays.  Novocaine is fine; gas is not recommended.  If your dentist requires a note from [place] prior to dental work please call the office and we will provide one for you.  Exercise: Exercise is an important part of staying healthy during your pregnancy.  You may continue most exercises you were accustomed to prior to pregnancy.  Later in your pregnancy you will most likely notice you have difficulty with activities requiring balance like riding a bicycle.  It is important that you listen to your body and avoid activities that put you at a higher risk of falling.  Adequate rest and staying well hydrated are a must!  If you have questions about the safety of specific activities ask your provider.    Exposure to Children with illness: Try to avoid obvious exposure; report any symptoms to [place] when noted,  If you have chicken pos, red measles or mumps, you should be immune to these diseases.   Please do not take any vaccines while pregnant unless you have checked with your OB provider.  Fetal Movement: After 28 weeks we recommend you do "kick counts" twice daily.  Lie or sit down in a calm quiet environment and count your baby movements "kicks".  You should feel your baby  at least 10 times per hour.  If you have not felt 10 kicks within the first hour get up, walk around and have something sweet to eat or drink then repeat for an additional hour.  If count remains less than 10 per hour notify your provider.  Fumigating: Follow your pest control agent's advice as to how long to stay out of your home.  Ventilate the area well before re-entering.  Hemorrhoids:   Most over-the-counter preparations can be used during pregnancy.  Check your medication to see what is safe to use.  It is important to use a stool softener or fiber in your diet and to drink lots of liquids.  If hemorrhoids seem to be getting worse please call the office.   Hot Tubs:  Hot tubs Jacuzzis and saunas are not recommended while pregnant.  These increase your internal body temperature and should be avoided.  Intercourse:  Sexual intercourse is safe during pregnancy as long as you are comfortable, unless otherwise advised by your provider.  Spotting may occur after intercourse; report any bright red bleeding that is heavier than spotting.  Labor:  If you know that you are in labor, please go to the hospital.  If you are unsure, please call the office and let us help you decide what to do.  Lifting, straining, etc:  If your job requires heavy lifting or straining please check with your provider for any limitations.  Generally, you should not lift items heavier than that you can lift simply with your hands and arms (no back muscles)  Painting:  Paint fumes do not harm your pregnancy, but may make you ill and should be avoided if possible.  Latex or water based paints have less odor than oils.  Use adequate ventilation while painting.  Permanents & Hair Color:  Chemicals in hair dyes are not recommended as they cause increase hair dryness which can increase hair loss during pregnancy.  " Highlighting" and permanents are allowed.  Dye may be absorbed differently and permanents may not hold as well during  pregnancy.  Sunbathing:  Use a sunscreen, as skin burns easily during pregnancy.  Drink plenty of fluids; avoid over heating.  Tanning Beds:  Because their possible side effects are still unknown, tanning beds are not recommended.  Ultrasound Scans:  Routine ultrasounds are performed at approximately 20 weeks.  You will be able to see your baby's general anatomy an if you would like to know the gender this can usually be determined as well.  If it is questionable when you conceived you may also receive an ultrasound early in your pregnancy for dating purposes.  Otherwise ultrasound exams are not routinely performed unless there is a medical necessity.  Although you can request a scan we ask that you pay for it when conducted because insurance does not cover " patient request" scans.  Work: If your pregnancy proceeds without complications you may work until your due date, unless your physician or employer advises otherwise.  Round Ligament Pain/Pelvic Discomfort:  Sharp, shooting pains not associated with bleeding are fairly common, usually occurring in the second trimester of pregnancy.  They tend to be worse when standing up or when you remain standing for long periods of time.  These are the result of pressure of certain pelvic ligaments called "round ligaments".  Rest, Tylenol and heat seem to be the most effective relief.  As the womb and fetus grow, they rise out of the pelvis and the discomfort improves.  Please notify the office if your pain seems different than that described.  It may represent a more serious condition.

## 2024-11-25 LAB — CBC/D/PLT+RPR+RH+ABO+RUBIGG...
Antibody Screen: NEGATIVE
Basophils Absolute: 0 x10E3/uL (ref 0.0–0.2)
Basos: 0 %
EOS (ABSOLUTE): 0.1 x10E3/uL (ref 0.0–0.4)
Eos: 1 %
HCV Ab: NONREACTIVE
HIV Screen 4th Generation wRfx: NONREACTIVE
Hematocrit: 37 % (ref 34.0–46.6)
Hemoglobin: 11.9 g/dL (ref 11.1–15.9)
Hepatitis B Surface Ag: NEGATIVE
Immature Grans (Abs): 0 x10E3/uL (ref 0.0–0.1)
Immature Granulocytes: 0 %
Lymphocytes Absolute: 2.2 x10E3/uL (ref 0.7–3.1)
Lymphs: 22 %
MCH: 30.5 pg (ref 26.6–33.0)
MCHC: 32.2 g/dL (ref 31.5–35.7)
MCV: 95 fL (ref 79–97)
Monocytes Absolute: 0.7 x10E3/uL (ref 0.1–0.9)
Monocytes: 7 %
Neutrophils Absolute: 7 x10E3/uL (ref 1.4–7.0)
Neutrophils: 70 %
Platelets: 233 x10E3/uL (ref 150–450)
RBC: 3.9 x10E6/uL (ref 3.77–5.28)
RDW: 12.6 % (ref 11.7–15.4)
RPR Ser Ql: NONREACTIVE
Rh Factor: POSITIVE
Rubella Antibodies, IGG: 0.97 {index} — ABNORMAL LOW
Varicella zoster IgG: REACTIVE
WBC: 10 x10E3/uL (ref 3.4–10.8)

## 2024-11-25 LAB — MONITOR DRUG PROFILE 14(MW)
Amphetamine Scrn, Ur: NEGATIVE ng/mL
BARBITURATE SCREEN URINE: NEGATIVE ng/mL
BENZODIAZEPINE SCREEN, URINE: NEGATIVE ng/mL
Buprenorphine, Urine: NEGATIVE ng/mL
CANNABINOIDS UR QL SCN: NEGATIVE ng/mL
Cocaine (Metab) Scrn, Ur: NEGATIVE ng/mL
Creatinine(Crt), U: 55.3 mg/dL (ref 20.0–300.0)
Fentanyl, Urine: NEGATIVE pg/mL
Meperidine Screen, Urine: NEGATIVE ng/mL
Methadone Screen, Urine: NEGATIVE ng/mL
OXYCODONE+OXYMORPHONE UR QL SCN: NEGATIVE ng/mL
Opiate Scrn, Ur: NEGATIVE ng/mL
Ph of Urine: 7.1 (ref 4.5–8.9)
Phencyclidine Qn, Ur: NEGATIVE ng/mL
Propoxyphene Scrn, Ur: NEGATIVE ng/mL
SPECIFIC GRAVITY: 1.009
Tramadol Screen, Urine: NEGATIVE ng/mL

## 2024-11-25 LAB — URINALYSIS, ROUTINE W REFLEX MICROSCOPIC
Bilirubin, UA: NEGATIVE
Glucose, UA: NEGATIVE
Ketones, UA: NEGATIVE
Leukocytes,UA: NEGATIVE
Nitrite, UA: NEGATIVE
Protein,UA: NEGATIVE
RBC, UA: NEGATIVE
Specific Gravity, UA: 1.012 (ref 1.005–1.030)
Urobilinogen, Ur: 0.2 mg/dL (ref 0.2–1.0)
pH, UA: 7.5 (ref 5.0–7.5)

## 2024-11-25 LAB — HCV INTERPRETATION

## 2024-11-25 LAB — COMPREHENSIVE METABOLIC PANEL WITH GFR
ALT: 9 IU/L (ref 0–32)
AST: 14 IU/L (ref 0–40)
Albumin: 4.1 g/dL (ref 3.9–4.9)
Alkaline Phosphatase: 67 IU/L (ref 41–116)
BUN/Creatinine Ratio: 18 (ref 9–23)
BUN: 12 mg/dL (ref 6–20)
Bilirubin Total: <0.2 mg/dL (ref 0.0–1.2)
CO2: 21 mmol/L (ref 20–29)
Calcium: 9.4 mg/dL (ref 8.7–10.2)
Chloride: 104 mmol/L (ref 96–106)
Creatinine, Ser: 0.65 mg/dL (ref 0.57–1.00)
Globulin, Total: 2.6 g/dL (ref 1.5–4.5)
Glucose: 81 mg/dL (ref 70–99)
Potassium: 4.1 mmol/L (ref 3.5–5.2)
Sodium: 138 mmol/L (ref 134–144)
Total Protein: 6.7 g/dL (ref 6.0–8.5)
eGFR: 117 mL/min/1.73

## 2024-11-25 LAB — PROTEIN / CREATININE RATIO, URINE
Creatinine, Urine: 51.3 mg/dL
Protein, Ur: 5.4 mg/dL
Protein/Creat Ratio: 105 mg/g{creat} (ref 0–200)

## 2024-11-25 LAB — HEMOGLOBIN A1C
Est. average glucose Bld gHb Est-mCnc: 105 mg/dL
Hgb A1c MFr Bld: 5.3 % (ref 4.8–5.6)

## 2024-11-26 LAB — URINE CYTOLOGY ANCILLARY ONLY
Chlamydia: NEGATIVE
Comment: NEGATIVE
Comment: NEGATIVE
Comment: NORMAL
Neisseria Gonorrhea: NEGATIVE
Trichomonas: NEGATIVE

## 2024-11-26 LAB — CULTURE, OB URINE

## 2024-11-26 LAB — URINE CULTURE, OB REFLEX

## 2024-11-28 LAB — MATERNIT 21 PLUS CORE, BLOOD
Fetal Fraction: 14
Result (T21): NEGATIVE
Trisomy 13 (Patau syndrome): NEGATIVE
Trisomy 18 (Edwards syndrome): NEGATIVE
Trisomy 21 (Down syndrome): NEGATIVE

## 2024-12-02 ENCOUNTER — Encounter: Payer: Self-pay | Admitting: Certified Nurse Midwife

## 2024-12-02 DIAGNOSIS — Z3490 Encounter for supervision of normal pregnancy, unspecified, unspecified trimester: Secondary | ICD-10-CM | POA: Insufficient documentation

## 2024-12-19 NOTE — Patient Instructions (Incomplete)
 Second Trimester of Pregnancy: What to Know  The second trimester of pregnancy is from week 14 through week 27. This is months 4 through 6 of pregnancy. During the second trimester: Morning sickness is less or has stopped. You may have more energy. You may feel hungry more often. At this time, your unborn baby is growing very fast. At the end of the sixth month, the unborn baby may be up to 12 inches long and weigh about 1 pounds. You will likely start to feel the baby move between 16 and 20 weeks of pregnancy. Body changes during your second trimester Your body continues to change during this time. The changes usually go away after your baby is born. Physical changes You will gain more weight. Your belly will get bigger. You may begin to get stretch marks on your hips, belly, and breasts. Your breasts will keep growing and may hurt. You may get dark spots or blotches on your face. A dark line from your belly button to the pubic area may appear. This line is called linea nigra. Your hair may grow faster and get thicker. Health changes You may have headaches. You may have heartburn. You may pee more often. You may have swollen, bulging veins (varicose veins). You may have trouble pooping (constipation), or swollen veins in the butt that can itch or get painful (hemorrhoids). You may have back pain. This is caused by: Weight gain. Pregnancy hormones that are relaxing the joints in your pelvis. Follow these instructions at home: Medicines Talk to your health care provider if you're taking medicines. Ask if the medicines are safe to take during pregnancy. Your provider may change the medicines that you take. Do not take any medicines unless told to by your provider. Take a prenatal vitamin that has at least 600 micrograms (mcg) of folic acid. Do not use herbal medicines, illegal drugs, or medicines that are not approved by your provider. Eating and drinking While you're pregnant your  body needs extra food for your growing baby. Talk with your provider about what to eat while pregnant. Activity Most women are able to exercise during pregnancy. Exercises may need to change as your pregnancy goes on. Talk to your provider about your activities and exercise routines. Relieving pain and discomfort Wear a good, supportive bra if your breasts hurt. Rest with your legs raised if you have leg cramps or low back pain. Take warm sitz baths to soothe pain from hemorrhoids. Use hemorrhoid cream if your provider says it's okay. Do not douche. Do not use tampons or scented pads. Do not use hot tubs, steam rooms, or saunas. Safety Wear your seatbelt at all times when you're in a car. Talk to your provider if someone hits you, hurts you, or yells at you. Talk with your provider if you're feeling sad or have thoughts of hurting yourself. Lifestyle Certain things can be harmful while you're pregnant. It's best to avoid the following: Do not drink alcohol,smoke, vape, or use products with nicotine  or tobacco in them. If you need help quitting, talk with your provider. Avoid cat litter boxes and soil used by cats. These things carry germs that can cause harm to your pregnancy and your baby. General instructions Keep all follow-up visits. It helps you and your unborn baby stay as healthy as possible. Write down your questions. Take them to your prenatal visits. Your provider will: Talk with you about your overall health. Give you advice or refer you to specialists who can help  with different needs, including: Prenatal education classes. Mental health and counseling. Foods and healthy eating. Ask for help if you need help with food. Where to find more information American Pregnancy Association: americanpregnancy.org [employer]necologists: acog.org Office on [employer] Health: [place] Contact a health care provider if: You have a headache that does not  go away when you take medicine. You have any of these problems: You can't eat or drink. You throw up or feel like you may throw up. You have watery poop (diarrhea) for 2 days or more. You have pain when you pee or your pee smells bad. You have been sick for 2 days or more and are not getting better. Contact your provider right away if: You have any of these coming from your vagina: Abnormal discharge. Bad-smelling fluid. Bleeding. Your baby is moving less than usual. You have contractions, belly cramping, or have pain in your pelvis or lower back. You have symptoms of high blood pressure or preeclampsia. These include: A severe, throbbing headache that does not go away. Sudden or extreme swelling of your face, hands, legs, or feet. Vision problems: You see spots. You have blurry vision. Your eyes are sensitive to light. If you can't reach the provider, go to an urgent care or emergency room. Get help right away if: You faint, become confused, or can't think clearly. You have chest pain or trouble breathing. You have any kind of injury, such as from a fall or a car crash. These symptoms may be an emergency. Call 911 right away. Do not wait to see if the symptoms will go away. Do not drive yourself to the hospital. This information is not intended to replace advice given to you by your health care provider. Make sure you discuss any questions you have with your health care provider. Document Revised: 09/18/2024 Document Reviewed: 03/09/2023 Elsevier Patient Education  2025 [employer].

## 2024-12-19 NOTE — Progress Notes (Unsigned)
" ° ° °  Return Prenatal Note   Subjective   39 y.o. H6E7997 at [redacted]w[redacted]d presents for this follow-up prenatal visit.  Patient *** Patient reports:    Objective   Flow sheet Vitals:   Total weight gain: 6 lb 4.8 oz (2.858 kg)  General Appearance  No acute distress, well appearing, and well nourished Pulmonary   Normal work of breathing Neurologic   Alert and oriented to person, place, and time Psychiatric   Mood and affect within normal limits   Assessment/Plan   Plan  39 y.o. H6E7997 at [redacted]w[redacted]d presents for follow-up OB visit. Reviewed prenatal record including previous visit note.  No problem-specific Assessment & Plan notes found for this encounter.      No orders of the defined types were placed in this encounter.  No follow-ups on file.   Future Appointments  Date Time Provider Department Center  12/23/2024  8:15 AM Jayne Harlene CROME, CNM [facility] None    For next visit:  {[email or web address]}     Rollo JINNY Maxin, CMA  01/30/261:17 PM  "

## 2024-12-23 ENCOUNTER — Encounter: Admitting: Certified Nurse Midwife

## 2024-12-25 ENCOUNTER — Encounter: Payer: Self-pay | Admitting: Certified Nurse Midwife

## 2024-12-25 ENCOUNTER — Ambulatory Visit: Admitting: Certified Nurse Midwife

## 2024-12-25 VITALS — BP 97/62 | HR 80 | Wt 162.6 lb

## 2024-12-25 DIAGNOSIS — Z3482 Encounter for supervision of other normal pregnancy, second trimester: Secondary | ICD-10-CM

## 2024-12-25 DIAGNOSIS — Z3689 Encounter for other specified antenatal screening: Secondary | ICD-10-CM

## 2024-12-25 DIAGNOSIS — Z3A18 18 weeks gestation of pregnancy: Secondary | ICD-10-CM

## 2024-12-25 NOTE — Progress Notes (Signed)
 ROB doing well, feeling movement. Discussed mode of delivery. Pt states she would like to try to VBAC this pregnancy. She notes that with the first pregnancy she got to complete and pushed x 3 hrs. States the baby would not come. Second pregnancy was a repeat. She was afraid that what happened first time would happened again. Discussed MD appointment for VBAC counseling around 30 weeks. She is in agreement. Follow up 2 weeks for anatomy u/s , rob in 4 weeks.   Zelda Hummer, CN N

## 2024-12-25 NOTE — Patient Instructions (Signed)
 Stretched Tissue in the Pelvic Area Causing Pain During Pregnancy (Round Ligament Pain): What It Means    Round ligament pain is common during pregnancy. The round ligaments are two cord-like parts that hold up the uterus. They stretch and get soft as the baby grows, which can cause pain.   This pain usually starts in the second trimester (13-28 weeks) of pregnancy and does not hurt the baby.  The pain is usually sharp and feels like a pinch. It lasts only a few seconds but can come and go until the baby is born. The pain may be felt in the belly, hip, or groin.  The pain comes when:  You change positions quickly, like when you go from a sitting to a standing position.  You do physical activity.  You cough, laugh, or sneeze.  Follow these instructions at home:  How to manage round ligament pain:  Bend your knees up to your belly when you need to sneeze, laugh, or cough.  Lie on your side with one pillow under your belly and another pillow between your legs.  Sit in a warm bath for 15-20 minutes or until the pain goes away.  Rest as told.  Take your medicines only as told.  General instructions  Stop or reduce your physical activities if they cause pain.  Move slowly when you sit down or stand up. Avoid sudden movements.  Use a support belt under your belly.  Do stretching exercises.  Contact a health care provider if:  Your pain doesn't go away or gets worse.  You feel pain in your back that you didn't have before.  Your medicine isn't helping.  You have fever or chills.  You throw up, or you feel like throwing up.  You have watery poop (diarrhea).  You have pain when you pee.  Get help right away if:  Your back pain is a rhythmic, cramping pain similar to labor pains.  Labor pains are usually 2 minutes apart and last for about 1 minute. Labor pains involve a bearing-down feeling or pressure in your pelvis.  You have back pain and your water breaks.  You have bleeding from your vagina.  These symptoms may be an  emergency. Call 911 right away.  Do not wait to see if the symptoms will go away.  Do not drive yourself to the hospital.  This information is not intended to replace advice given to you by your health care provider. Make sure you discuss any questions you have with your health care provider.  Document Revised: 04/21/2024 Document Reviewed: 04/21/2024  Elsevier Patient Education ?? [employer].

## 2025-01-09 ENCOUNTER — Encounter: Admitting: Certified Nurse Midwife

## 2025-01-09 ENCOUNTER — Other Ambulatory Visit
